# Patient Record
Sex: Male | Born: 1985 | Race: Black or African American | Hispanic: No | Marital: Single | Smoking: Never smoker
Health system: Southern US, Community
[De-identification: ages and names within clinical notes are randomized; demographics above are authoritative.]

---

## 2019-04-18 ENCOUNTER — Emergency Department: Payer: Medicaid Other

## 2019-04-18 ENCOUNTER — Other Ambulatory Visit: Payer: Self-pay

## 2019-04-18 ENCOUNTER — Emergency Department
Admission: EM | Admit: 2019-04-18 | Discharge: 2019-04-18 | Disposition: A | Discharge status: Home / Self Care | Payer: Medicaid Other | Attending: Emergency Medicine | Admitting: Emergency Medicine

## 2019-04-18 DIAGNOSIS — M545 Low back pain: Secondary | ICD-10-CM | POA: Insufficient documentation

## 2019-04-18 DIAGNOSIS — M542 Cervicalgia: Secondary | ICD-10-CM | POA: Insufficient documentation

## 2019-04-18 MED ORDER — KETOROLAC TROMETHAMINE 30 MG/ML IJ SOLN
30.0000 mg | Freq: Once | INTRAMUSCULAR | Status: AC
  Administered 2019-04-18: 30 mg via INTRAMUSCULAR
  Filled 2019-04-18: qty 1

## 2019-04-18 MED ORDER — MELOXICAM 15 MG PO TABS: 15.0000 mg | ORAL_TABLET | Freq: Every day | ORAL | 1 refills | Status: AC

## 2019-04-18 MED ORDER — METHOCARBAMOL 500 MG PO TABS: 500.0000 mg | ORAL_TABLET | Freq: Three times a day (TID) | ORAL | 0 refills | Status: AC | PRN
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
## 2019-09-03 ENCOUNTER — Emergency Department
Admission: EM | Admit: 2019-09-03 | Discharge: 2019-09-03 | Disposition: A | Discharge status: Home / Self Care | Payer: Medicaid Other | Attending: Emergency Medicine | Admitting: Emergency Medicine

## 2019-09-03 ENCOUNTER — Encounter: Payer: Self-pay | Admitting: Emergency Medicine

## 2019-09-03 ENCOUNTER — Other Ambulatory Visit: Payer: Self-pay

## 2019-09-03 DIAGNOSIS — L600 Ingrowing nail: Secondary | ICD-10-CM | POA: Insufficient documentation

## 2019-09-03 DIAGNOSIS — M79675 Pain in left toe(s): Secondary | ICD-10-CM | POA: Diagnosis not present

## 2019-09-03 DIAGNOSIS — L089 Local infection of the skin and subcutaneous tissue, unspecified: Secondary | ICD-10-CM

## 2019-09-03 DIAGNOSIS — Z5321 Procedure and treatment not carried out due to patient leaving prior to being seen by health care provider: Secondary | ICD-10-CM | POA: Insufficient documentation

## 2019-09-03 MED ORDER — SULFAMETHOXAZOLE-TRIMETHOPRIM 800-160 MG PO TABS: 1.0000 | ORAL_TABLET | Freq: Two times a day (BID) | ORAL | 0 refills | Status: DC

## 2019-09-03 NOTE — ED Triage Notes (Signed)
Pt here for ingrown toe nail to left great toe.  Reports redness and pus draining.  Was here last night but left before seen by MD.  No fevers. Ambulatory. NAD

## 2019-09-03 NOTE — ED Provider Notes (Signed)
[facility] Emergency Department Provider Note  ____________________________________________  Time seen: Approximately 8:02 AM  I have reviewed the triage vital signs and the nursing notes.   HISTORY  Chief Complaint Ingrown Toenail    HPI William Monroe is a 34 y.o. male that presents to the emergency department for evaluation of drainage from left ingrown toenail for 2 days. Skin around toenail has been painful for 2 weeks.  Patient came to the emergency department last night but left due to the long wait.  No fevers   History reviewed. No pertinent past medical history.  There are no problems to display for this patient.   History reviewed. No pertinent surgical history.  Prior to Admission medications   Medication Sig Start Date End Date Taking? Authorizing Provider  sulfamethoxazole-trimethoprim (BACTRIM DS) 800-160 MG tablet Take 1 tablet by mouth 2 (two) times daily. 09/03/19   Laban Emperor, PA-C    Allergies Patient has no known allergies.  History reviewed. No pertinent family history.  Social History Social History   Tobacco Use  . Smoking status: Never Smoker  . Smokeless tobacco: Never Used  Substance Use Topics  . Alcohol use: Not on file  . Drug use: Not on file     Review of Systems  Constitutional: No fever/chills Gastrointestinal: No nausea, no vomiting.  Musculoskeletal: Positive for toe pain. Skin: Negative for rash, abrasions, lacerations, ecchymosis. Neurological: Negative for headaches   ____________________________________________   PHYSICAL EXAM:  VITAL SIGNS: ED Triage Vitals  Enc Vitals Group     BP 09/03/19 0729 130/79     Pulse Rate 09/03/19 0729 76     Resp 09/03/19 0729 14     Temp 09/03/19 0729 98 F (36.7 C)     Temp Source 09/03/19 0729 Oral     SpO2 09/03/19 0729 97 %     Weight 09/03/19 0723 230 lb (104.3 kg)     Height 09/03/19 0723 6\' 1"  (1.854 m)     Head Circumference --      Peak  Flow --      Pain Score 09/03/19 0723 9     Pain Loc --      Pain Edu? --      Excl. in [place]? --      Constitutional: Alert and oriented. Well appearing and in no acute distress. Eyes: Conjunctivae are normal. PERRL. EOMI. Head: Atraumatic. ENT:      Ears:      Nose: No congestion/rhinnorhea.      Mouth/Throat: Mucous membranes are moist.  Neck: No stridor.  Cardiovascular: Normal rate, regular rhythm.  Good peripheral circulation. Respiratory: Normal respiratory effort without tachypnea or retractions. Lungs CTAB. Good air entry to the bases with no decreased or absent breath sounds. Musculoskeletal: Full range of motion to all extremities. No gross deformities appreciated. Neurologic:  Normal speech and language. No gross focal neurologic deficits are appreciated.  Skin:  Skin is warm, dry.  Thickened yellow discolored left great toenail. Drainage from lateral toenail. Mild swelling. Psychiatric: Mood and affect are normal. Speech and behavior are normal. Patient exhibits appropriate insight and judgement.   ____________________________________________   LABS (all labs ordered are listed, but only abnormal results are displayed)  Labs Reviewed - No data to display ____________________________________________  EKG   ____________________________________________  RADIOLOGY  No results found.  ____________________________________________    PROCEDURES  Procedure(s) performed:    Procedures    Medications - No data to display   ____________________________________________  INITIAL IMPRESSION / ASSESSMENT AND PLAN / ED COURSE  Pertinent labs & imaging results that were available during my care of the patient were reviewed by me and considered in my medical decision making (see chart for details).  Review of the [place] CSRS was performed in accordance of the [facility] prior to dispensing any controlled drugs.   Patient's diagnosis is consistent with infected  ingrown toenail. Patient will be discharged home with prescriptions for Bactrim. Patient is to follow up with podiatry as directed. Patient is given ED precautions to return to the ED for any worsening or new symptoms.   William Monroe was evaluated in Emergency Department on 09/03/2019 for the symptoms described in the history of present illness. He was evaluated in the context of the global COVID-19 pandemic, which necessitated consideration that the patient might be at risk for infection with the SARS-CoV-2 virus that causes COVID-19. Institutional protocols and algorithms that pertain to the evaluation of patients at risk for COVID-19 are in a state of rapid change based on information released by regulatory bodies including the CDC and federal and state organizations. These policies and algorithms were followed during the patient's care in the ED.  ____________________________________________  FINAL CLINICAL IMPRESSION(S) / ED DIAGNOSES  Final diagnoses:  Ingrown toenail  Toe infection      NEW MEDICATIONS STARTED DURING THIS VISIT:  ED Discharge Orders         Ordered    sulfamethoxazole-trimethoprim (BACTRIM DS) 800-160 MG tablet  2 times daily     09/03/19 1610              This chart was dictated using voice recognition software/Dragon. Despite best efforts to proofread, errors can occur which can change the meaning. Any change was purely unintentional.    Enid Derry, PA-C 09/03/19 1105    Chesley Noon, MD 09/03/19 [phone]

## 2019-09-03 NOTE — ED Notes (Signed)
See triage note  Presents with possible infection to left great toe  States he noticed pain about 1 week ago  Developed drainage today

## 2019-09-03 NOTE — ED Triage Notes (Signed)
Patient ambulatory to triage with steady gait, without difficulty or distress noted,mask in place; pt reports pain and drainage noted around left great toenail x wk

## 2019-09-17 ENCOUNTER — Other Ambulatory Visit: Payer: Self-pay

## 2019-09-17 ENCOUNTER — Ambulatory Visit: Payer: Medicaid Other | Admitting: Family

## 2019-09-17 ENCOUNTER — Ambulatory Visit
Admission: EM | Admit: 2019-09-17 | Discharge: 2019-09-17 | Discharge status: Against Medical Advice | Payer: Medicaid Other | Attending: Family Medicine | Admitting: Family Medicine

## 2019-09-17 NOTE — ED Triage Notes (Signed)
Patient complains of left big toe pain and ingrown nail. Patient states that he has been dealing with this for 3 weeks. Reports that he was Rx'ed Bactrim on 05/07 by [facility] ED.

## 2019-12-25 ENCOUNTER — Other Ambulatory Visit: Payer: Self-pay

## 2019-12-25 ENCOUNTER — Emergency Department
Admission: EM | Admit: 2019-12-25 | Discharge: 2019-12-25 | Disposition: A | Discharge status: Home / Self Care | Payer: Medicaid Other | Attending: Emergency Medicine | Admitting: Emergency Medicine

## 2019-12-25 DIAGNOSIS — J3489 Other specified disorders of nose and nasal sinuses: Secondary | ICD-10-CM | POA: Insufficient documentation

## 2019-12-25 DIAGNOSIS — Z1152 Encounter for screening for COVID-19: Secondary | ICD-10-CM

## 2019-12-25 DIAGNOSIS — R519 Headache, unspecified: Secondary | ICD-10-CM | POA: Diagnosis not present

## 2019-12-25 DIAGNOSIS — R197 Diarrhea, unspecified: Secondary | ICD-10-CM | POA: Insufficient documentation

## 2019-12-25 DIAGNOSIS — Z20822 Contact with and (suspected) exposure to covid-19: Secondary | ICD-10-CM | POA: Insufficient documentation

## 2019-12-25 DIAGNOSIS — R112 Nausea with vomiting, unspecified: Secondary | ICD-10-CM | POA: Diagnosis not present

## 2019-12-25 LAB — COMPREHENSIVE METABOLIC PANEL
ALT: 32 U/L (ref 0–44)
AST: 27 U/L (ref 15–41)
Albumin: 4.2 g/dL (ref 3.5–5.0)
Alkaline Phosphatase: 80 U/L (ref 38–126)
Anion gap: 8 (ref 5–15)
BUN: 10 mg/dL (ref 6–20)
CO2: 27 mmol/L (ref 22–32)
Calcium: 8.9 mg/dL (ref 8.9–10.3)
Chloride: 105 mmol/L (ref 98–111)
Creatinine, Ser: 1.12 mg/dL (ref 0.61–1.24)
GFR calc Af Amer: >60 mL/min (ref >60)
GFR calc non Af Amer: >60 mL/min (ref >60)
Glucose, Bld: 103 mg/dL — ABNORMAL HIGH (ref 70–99)
Potassium: 3.7 mmol/L (ref 3.5–5.1)
Sodium: 140 mmol/L (ref 135–145)
Total Bilirubin: 0.7 mg/dL (ref 0.3–1.2)
Total Protein: 7.1 g/dL (ref 6.5–8.1)

## 2019-12-25 LAB — CBC
HCT: 47.6 % (ref 39.0–52.0)
Hemoglobin: 16.3 g/dL (ref 13.0–17.0)
MCH: 29.7 pg (ref 26.0–34.0)
MCHC: 34.2 g/dL (ref 30.0–36.0)
MCV: 86.7 fL (ref 80.0–100.0)
Platelets: 312 10*3/uL (ref 150–400)
RBC: 5.49 MIL/uL (ref 4.22–5.81)
RDW: 12.8 % (ref 11.5–15.5)
WBC: 16 10*3/uL — ABNORMAL HIGH (ref 4.0–10.5)
nRBC: 0 % (ref 0.0–0.2)

## 2019-12-25 LAB — SARS CORONAVIRUS 2 BY RT PCR (HOSPITAL ORDER, PERFORMED IN ~~LOC~~ HOSPITAL LAB): SARS Coronavirus 2: NEGATIVE

## 2019-12-25 LAB — LIPASE, BLOOD: Lipase: 21 U/L (ref 11–51)

## 2019-12-25 MED ORDER — ONDANSETRON 4 MG PO TBDP: 4.0000 mg | ORAL_TABLET | Freq: Three times a day (TID) | ORAL | 0 refills | Status: AC | PRN

## 2019-12-25 NOTE — ED Notes (Addendum)
See triage note  States he developed n/v/d with h/a and runny nose couple of days ago  No fever and is currently afebrile  Last time vomited was about 5am  Last diarrhea was prior to arrival to room

## 2019-12-25 NOTE — ED Provider Notes (Signed)
[facility] Emergency Department Provider Note  ____________________________________________   First MD Initiated Contact with Patient 12/25/19 [phone]     (approximate)  I have reviewed the triage vital signs and the nursing notes.   HISTORY  Chief Complaint Nausea and Emesis   HPI William Monroe is a 34 y.o. male presents to the ED with complaint of nausea, vomiting, diarrhea with headache and rhinorrhea for several days.  Patient states that he last vomited at approximately 5 AM.  He has continued to have diarrhea.  He has not been vaccinated for Covid.  He states that this is his main concern today.     No past medical history on file.  There are no problems to display for this patient.   No past surgical history on file.  Prior to Admission medications   Medication Sig Start Date End Date Taking? Authorizing Provider  ondansetron (ZOFRAN ODT) 4 MG disintegrating tablet Take 1 tablet (4 mg total) by mouth every 8 (eight) hours as needed for nausea or vomiting. 12/25/19   Tommi Rumps, PA-C    Allergies Patient has no known allergies.  No family history on file.  Social History Social History   Tobacco Use  . Smoking status: Never Smoker  . Smokeless tobacco: Never Used  Vaping Use  . Vaping Use: Never used  Substance Use Topics  . Alcohol use: Not on file  . Drug use: Not on file    Review of Systems Constitutional: Subjective fever/chills Eyes: No visual changes. ENT: No sore throat.  Positive rhinorrhea. Cardiovascular: Denies chest pain. Respiratory: Denies shortness of breath. Gastrointestinal: No abdominal pain.  Positive nausea, positive vomiting.  Positive diarrhea.  No constipation. Genitourinary: Negative for dysuria. Musculoskeletal: Negative for back pain. Skin: Negative for rash. Neurological: Positive for headaches, negative for focal weakness or numbness. ____________________________________________   PHYSICAL  EXAM:  VITAL SIGNS: ED Triage Vitals  Enc Vitals Group     BP 12/25/19 0550 (!) 129/96     Pulse Rate 12/25/19 0550 94     Resp 12/25/19 0550 18     Temp 12/25/19 0550 98.4 F (36.9 C)     Temp Source 12/25/19 0550 Oral     SpO2 12/25/19 0550 100 %     Weight 12/25/19 0548 231 lb (104.8 kg)     Height 12/25/19 0548 6\' 1"  (1.854 m)     Head Circumference --      Peak Flow --      Pain Score --      Pain Loc --      Pain Edu? --      Excl. in [place]? --     Constitutional: Alert and oriented. Well appearing and in no acute distress. Eyes: Conjunctivae are normal. PERRL. EOMI. Head: Atraumatic. Nose: Mild congestion/rhinnorhea. Neck: No stridor.   Cardiovascular: Normal rate, regular rhythm. Grossly normal heart sounds.  Good peripheral circulation. Respiratory: Normal respiratory effort.  No retractions. Lungs CTAB. Gastrointestinal: Soft and nontender. No distention.  Bowel sounds are hyperactive at present. Musculoskeletal: Moves upper and lower extremities that any difficulty.  Normal gait was noted. Neurologic:  Normal speech and language. No gross focal neurologic deficits are appreciated. No gait instability. Skin:  Skin is warm, dry and intact. No rash noted. Psychiatric: Mood and affect are normal. Speech and behavior are normal.  ____________________________________________   LABS (all labs ordered are listed, but only abnormal results are displayed)  Labs Reviewed  COMPREHENSIVE METABOLIC PANEL -  Abnormal; Notable for the following components:      Result Value   Glucose, Bld 103 (*)    All other components within normal limits  CBC - Abnormal; Notable for the following components:   WBC 16.0 (*)    All other components within normal limits  SARS CORONAVIRUS 2 BY RT PCR (HOSPITAL ORDER, PERFORMED IN [facility])  LIPASE, BLOOD  URINALYSIS, COMPLETE (UACMP) WITH MICROSCOPIC     PROCEDURES  Procedure(s) performed (including Critical  Care):  Procedures   ____________________________________________   INITIAL IMPRESSION / ASSESSMENT AND PLAN / ED COURSE  As part of my medical decision making, I reviewed the following data within the electronic MEDICAL RECORD NUMBER Notes from prior ED visits and [place] Controlled Substance Database  William Monroe was evaluated in Emergency Department on 12/25/2019 for the symptoms described in the history of present illness. He was evaluated in the context of the global COVID-19 pandemic, which necessitated consideration that the patient might be at risk for infection with the SARS-CoV-2 virus that causes COVID-19. Institutional protocols and algorithms that pertain to the evaluation of patients at risk for COVID-19 are in a state of rapid change based on information released by regulatory bodies including the CDC and federal and state organizations. These policies and algorithms were followed during the patient's care in the ED.  34 year old male presents to the ED with complaint of nausea, vomiting, diarrhea, rhinorrhea and headache.  Patient is concerned about Covid.  He has not had the Covid vaccine.  Physical exam was unremarkable.  Lab work was reviewed.  Covid test was sent to the lab and was pending at the time of patient's discharge.  He is aware that he can get this information with my chart.  We discussed clear liquids which should help with the diarrhea.  A prescription for Zofran was sent to his pharmacy.  Patient is to continue Tylenol or ibuprofen as needed for headache and generalized aching.  Patient was told that he should return to the emergency room immediately should he develop any shortness of breath or difficulty breathing.  ____________________________________________   FINAL CLINICAL IMPRESSION(S) / ED DIAGNOSES  Final diagnoses:  Nausea vomiting and diarrhea  Encounter for screening for COVID-19     ED Discharge Orders         Ordered    ondansetron (ZOFRAN ODT) 4 MG  disintegrating tablet  Every 8 hours PRN        12/25/19 0951           Note:  This document was prepared using Dragon voice recognition software and may include unintentional dictation errors.    Tommi Rumps, PA-C 12/25/19 1242    Delton [place], MD 12/25/19 1550

## 2019-12-25 NOTE — Discharge Instructions (Signed)
Follow-up with your primary care provider or return to the emergency department if any severe worsening of your symptoms.  A prescription for Zofran was sent to the pharmacy to take if you continue to have nausea and vomiting.  Remain on clear liquids today which should help with the diarrhea.  You may take Tylenol or ibuprofen as needed for fever, headache, body aches.  Until you have received the results of your Covid test you should quarantine from other people.  Should your test for Covid be positive you will need to quarantine additional 10 days.

## 2019-12-25 NOTE — ED Triage Notes (Signed)
Patient reports nausea, vomiting diarrhea, runny nose and headache.  Patient also reports he was dizzy earlier.

## 2019-12-25 NOTE — ED Notes (Signed)
Pt called for vital sign recheck, pt in restroom
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
## 2021-04-08 ENCOUNTER — Emergency Department
Admission: EM | Admit: 2021-04-08 | Discharge: 2021-04-08 | Disposition: A | Discharge status: Home / Self Care | Payer: Medicaid Other | Attending: Emergency Medicine | Admitting: Emergency Medicine

## 2021-04-08 ENCOUNTER — Other Ambulatory Visit: Payer: Self-pay

## 2021-04-08 ENCOUNTER — Encounter: Payer: Self-pay | Admitting: Emergency Medicine

## 2021-04-08 DIAGNOSIS — L03113 Cellulitis of right upper limb: Secondary | ICD-10-CM | POA: Diagnosis present

## 2021-04-08 DIAGNOSIS — L03011 Cellulitis of right finger: Secondary | ICD-10-CM

## 2021-04-08 NOTE — ED Triage Notes (Signed)
Pt reports ingrown fingernail to right hand ring finger for several days and is painful and swollen

## 2021-04-08 NOTE — ED Provider Notes (Signed)
[facility] Emergency Department Provider Note   ____________________________________________    I have reviewed the triage vital signs and the nursing notes.   HISTORY  Chief Complaint Finger Injury     HPI William Monroe is a 35 y.o. male who presents with infection of the fingernail of the right ring finger.  Reports is been painful for 3 days, tender to touch.  No spreading redness  History reviewed. No pertinent past medical history.  There are no problems to display for this patient.   History reviewed. No pertinent surgical history.  Prior to Admission medications   Medication Sig Start Date End Date Taking? Authorizing Provider  ondansetron (ZOFRAN ODT) 4 MG disintegrating tablet Take 1 tablet (4 mg total) by mouth every 8 (eight) hours as needed for nausea or vomiting. 12/25/19   Tommi Rumps, PA-C     Allergies Patient has no known allergies.  No family history on file.  Social History Social History   Tobacco Use   Smoking status: Never   Smokeless tobacco: Never  Vaping Use   Vaping Use: Never used    Review of Systems  Constitutional: No fever/chills     Musculoskeletal: Pain in the finger Skin: As above     ____________________________________________   PHYSICAL EXAM:  VITAL SIGNS: ED Triage Vitals  Enc Vitals Group     BP 04/08/21 1521 130/77     Pulse Rate 04/08/21 1521 66     Resp 04/08/21 1521 18     Temp 04/08/21 1521 98 F (36.7 C)     Temp src --      SpO2 04/08/21 1521 98 %     Weight 04/08/21 1508 108.9 kg (240 lb)     Height 04/08/21 1508 1.854 m (6\' 1" )     Head Circumference --      Peak Flow --      Pain Score 04/08/21 1508 9     Pain Loc --      Pain Edu? --      Excl. in GC? --     Constitutional: Alert and oriented. No acute distress.    Musculoskeletal: No lower extremity tenderness nor edema.   Neurologic:  Normal speech and language. No gross focal neurologic  deficits are appreciated.   Skin:  Skin is warm, dry and intact.  Paronychia right fourth finger   ____________________________________________   LABS (all labs ordered are listed, but only abnormal results are displayed)  Labs Reviewed - No data to display ____________________________________________  EKG   ____________________________________________  RADIOLOGY   ____________________________________________   PROCEDURES  Procedure(s) performed: yes  .14/11/22Incision and Drainage  Date/Time: 04/08/2021 5:53 PM Performed by: 14/02/2021, MD Authorized by: Jene Every, MD   Consent:    Consent obtained:  Verbal Location:    Type:  Abscess Pre-procedure details:    Skin preparation:  Antiseptic wash Sedation:    Sedation type:  None Procedure type:    Complexity:  Simple Procedure details:    Incision types:  Single straight   Incision depth:  Dermal   Drainage:  Purulent   Drainage amount:  Scant   Packing materials:  None Post-procedure details:    Procedure completion:  Tolerated   Critical Care performed: No ____________________________________________   INITIAL IMPRESSION / ASSESSMENT AND PLAN / ED COURSE  Pertinent labs & imaging results that were available during my care of the patient were reviewed by me and considered in my medical decision  making (see chart for details).   Patient with paronychia 11 blade used, outpatient follow-up as needed   ____________________________________________   FINAL CLINICAL IMPRESSION(S) / ED DIAGNOSES  Final diagnoses:  Paronychia of finger of right hand      NEW MEDICATIONS STARTED DURING THIS VISIT:  Discharge Medication List as of 04/08/2021  4:38 PM       Note:  This document was prepared using Dragon voice recognition software and may include unintentional dictation errors.    Jene Every, MD 04/08/21 1754
